# Patient Record
Sex: Female | Born: 1980
Health system: Southern US, Community
[De-identification: ages and names within clinical notes are randomized; demographics above are authoritative.]

## PROBLEM LIST (undated history)

## (undated) DIAGNOSIS — N200 Calculus of kidney: Secondary | ICD-10-CM

## (undated) DIAGNOSIS — J189 Pneumonia, unspecified organism: Secondary | ICD-10-CM

## (undated) DIAGNOSIS — U071 COVID-19: Secondary | ICD-10-CM

## (undated) HISTORY — PX: CHOLECYSTECTOMY: SHX55

## (undated) HISTORY — PX: KNEE ARTHROSCOPY: SUR90

## (undated) HISTORY — PX: TONSILLECTOMY: SUR1361

## (undated) HISTORY — PX: CYSTOSCOPY W/ URETEROSCOPY W/ LITHOTRIPSY: SUR380

---
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
## 2001-06-23 ENCOUNTER — Inpatient Hospital Stay (HOSPITAL_COMMUNITY): Admission: AD | Admit: 2001-06-23 | Discharge: 2001-06-23 | Payer: Self-pay | Admitting: Obstetrics and Gynecology

## 2001-07-02 ENCOUNTER — Inpatient Hospital Stay (HOSPITAL_COMMUNITY): Admission: AD | Admit: 2001-07-02 | Discharge: 2001-07-06 | Payer: Self-pay | Admitting: Obstetrics and Gynecology

## 2001-07-07 ENCOUNTER — Inpatient Hospital Stay (HOSPITAL_COMMUNITY): Admission: AD | Admit: 2001-07-07 | Discharge: 2001-07-10 | Payer: Self-pay | Admitting: Obstetrics and Gynecology

## 2001-07-27 ENCOUNTER — Inpatient Hospital Stay (HOSPITAL_COMMUNITY): Admission: AD | Admit: 2001-07-27 | Discharge: 2001-07-27 | Payer: Self-pay | Admitting: Obstetrics & Gynecology

## 2001-10-27 ENCOUNTER — Emergency Department (HOSPITAL_COMMUNITY): Admission: EM | Admit: 2001-10-27 | Discharge: 2001-10-28 | Payer: Self-pay | Admitting: Emergency Medicine

## 2002-03-24 ENCOUNTER — Observation Stay (HOSPITAL_COMMUNITY): Admission: EM | Admit: 2002-03-24 | Discharge: 2002-03-25 | Payer: Self-pay | Admitting: Emergency Medicine

## 2002-03-24 ENCOUNTER — Encounter: Payer: Self-pay | Admitting: Emergency Medicine

## 2002-03-24 ENCOUNTER — Encounter (INDEPENDENT_AMBULATORY_CARE_PROVIDER_SITE_OTHER): Payer: Self-pay

## 2002-03-26 ENCOUNTER — Inpatient Hospital Stay (HOSPITAL_COMMUNITY): Admission: EM | Admit: 2002-03-26 | Discharge: 2002-03-30 | Payer: Self-pay | Admitting: Emergency Medicine

## 2002-03-26 ENCOUNTER — Encounter: Payer: Self-pay | Admitting: Internal Medicine

## 2002-03-28 ENCOUNTER — Encounter: Payer: Self-pay | Admitting: Urology

## 2004-09-14 ENCOUNTER — Emergency Department (HOSPITAL_COMMUNITY): Admission: EM | Admit: 2004-09-14 | Discharge: 2004-09-14 | Payer: Self-pay | Admitting: Emergency Medicine

## 2004-09-15 ENCOUNTER — Emergency Department (HOSPITAL_COMMUNITY): Admission: EM | Admit: 2004-09-15 | Discharge: 2004-09-16 | Payer: Self-pay | Admitting: Emergency Medicine

## 2005-04-24 ENCOUNTER — Other Ambulatory Visit: Admission: RE | Admit: 2005-04-24 | Discharge: 2005-04-24 | Payer: Self-pay | Admitting: Obstetrics and Gynecology

## 2007-11-04 ENCOUNTER — Ambulatory Visit: Payer: Self-pay | Admitting: Occupational Medicine

## 2008-02-16 ENCOUNTER — Telehealth (INDEPENDENT_AMBULATORY_CARE_PROVIDER_SITE_OTHER): Payer: Self-pay | Admitting: Occupational Medicine

## 2008-02-16 ENCOUNTER — Encounter (INDEPENDENT_AMBULATORY_CARE_PROVIDER_SITE_OTHER): Payer: Self-pay | Admitting: Occupational Medicine

## 2008-02-16 ENCOUNTER — Ambulatory Visit: Payer: Self-pay | Admitting: Family Medicine

## 2008-02-16 DIAGNOSIS — H60509 Unspecified acute noninfective otitis externa, unspecified ear: Secondary | ICD-10-CM | POA: Insufficient documentation

## 2008-04-27 ENCOUNTER — Ambulatory Visit: Payer: Self-pay | Admitting: Occupational Medicine

## 2008-04-27 LAB — CONVERTED CEMR LAB
Bilirubin Urine: NEGATIVE
Ketones, urine, test strip: NEGATIVE
Nitrite: POSITIVE
pH: 6

## 2008-05-05 ENCOUNTER — Emergency Department (HOSPITAL_BASED_OUTPATIENT_CLINIC_OR_DEPARTMENT_OTHER): Admission: EM | Admit: 2008-05-05 | Discharge: 2008-05-05 | Payer: Self-pay | Admitting: Emergency Medicine

## 2010-05-29 DIAGNOSIS — R2230 Localized swelling, mass and lump, unspecified upper limb: Secondary | ICD-10-CM | POA: Insufficient documentation

## 2010-06-30 NOTE — Discharge Summary (Signed)
[facility]  Patient:    Sara Henderson, Sara Henderson Visit Number: [id number] MRN: [id number]          Service Type: MED Location: [facility] Attending Physician:  Lars Pinks Dictated by:   Devoria Albe Edward Jolly, M.D. Admit Date:  07/27/2001 Discharge Date: 07/27/2001                             Discharge Summary  ADMISSION DIAGNOSES:          1. Status post primary low segment transverse                                  cesarean section on Jul 03, 2001.                               2. Endomyometritis.                               3. Betadine burn of the vulva.  DISCHARGE DIAGNOSES:          1. Status post primary low segment transverse                                  cesarean section on Jul 03, 2001.                               2. Endomyometritis, improved.                               3. Betadine burn of the vulva.  ADMISSION HISTORY AND PERTINENT PHYSICAL EXAM:      The patient was a 30 year old, Gravida 1, Para 1-0-0-1, Caucasian female, status post primary low segment transverse cesarean section on Jul 03, 2001 for a nonreassuring fetal assessment with arrest of cervical dilatation, who presented to the hospital on Jul 07, 2001, one day after her discharge complaining of increased abdominal pain, nausea, and a fever to 101 degrees Fahrenheit.  The patients postoperative hospital course had been significant for an irritation of the coccyx and vulvar dermatitis which was thought to be secondary to Betadine use at the time of the patients surgery.  The patients past medical history was remarkable for nephrolithiasis.  The patients physical exam on admission was significant for a temperature of 99.2 degrees Fahrenheit, blood pressure 132/97 and a pulse of 120. Respiratory rate was 24.  The lungs were clear to auscultation bilaterally. The heart demonstrated S1 and S2 with tachycardia.  No evidence of murmur. The breasts were engorged without  evidence of any erythema or streaking.  The back demonstrated no CVA tenderness.  The epidural site demonstrated no ecchymosis or erythema.  The abdomen demonstrated the uterus with the fundus tender to touch.  The incision of the lower abdomen was intact and without erythema.  Pelvic examination demonstrated a deep erythematous rash of the vulva with desquamation of the skin.  Cervical motion tenderness was present and the uterus was tender on bimanual examination.  There was evidence of bilateral adnexal tenderness without masses.  LABORATORY DATA:  WBC 10.5, hematocrit 26.6%.  A catheter specimen of urine demonstrated no WBCs and no RBCs with a trace of leukocyte esterase and 15 mg per dl of ketones.  HOSPITAL COURSE:              The patient was diagnosed with endomyometritis and a Betadine burn of the vulva and was admitted for triple antibiotic therapy with Ampicillin, Gentamicin and Clindamycin, pain control with Percocet and Motrin, and Silvadene cream to the vulva in addition to oral iron therapy for the anemia.  The patients hospital course was unremarkable.  The patient remained afebrile for greater than 48 hours, at which time a decision was made to discharge the patient to home.  She was started on Augmentin 875 mg p.o. b.i.d. the day prior to her discharge.  She was found to be in recovery status and ready for discharge on Jul 10, 2001.  DISCHARGE INSTRUCTIONS:       Augmentin 875 mg p.o. b.i.d. x1 week.  The patient will have a regular diet.  She will continue on decreased activity. She will follow up in the office in one week for a recheck and she will call if she experiences increased fever, increased pain, incision drainage, or any other concern. Dictated by:   Devoria Albe Edward Jolly, M.D. Attending Physician:  Lars Pinks DD:  08/18/01 TD:  08/20/01 Job: [id number]

## 2010-06-30 NOTE — Discharge Summary (Signed)
[facility]  Patient:    Sara Henderson, Sara Henderson Visit Number: [id number] MRN: [id number]          Service Type: MED Location: [facility] Attending Physician:  Lars Pinks Dictated by:   Leilani Able, P.A. Admit Date:  07/27/2001 Discharge Date: 07/27/2001                             Discharge Summary  FINAL DIAGNOSES:              Intrauterine pregnancy at term, active labor, nonreassuring fetal heart tracing, shoulder/body cord.  PROCEDURE:                    Primary low transverse cesarean section.  SURGEON:                      Gerrit Friends. Aldona Bar, M.D.  COMPLICATIONS:                None.  HISTORY:                      This 30 year old G1, P0 presented the evening of May 21 in early labor.  On evaluation at 7:30 a.m. on the 22nd patients cervix had been unchanged and she was having very few contractions.  The patient was offered induction of labor with ruptured membranes versus going home and she chose the former.  At the time of examination patient was about 1 cm dilated, 60% effaced, and -2 station.  Amniotomy was carried and IUPCs were placed and Pitocin was begun.  The patient progressed to about 3-4 cm of dilation, had an epidural, and approximately an hour or so later began having variable decelerations with late recovery.  This was not responsive to position change, oxygen, turning off her Pitocin.  The patients cervix was still unchanged at the time and it was Dr. Willia Craze feeling that this was probably a cord problem.  At this point decision was made to proceed with a cesarean section.  The patient was taken to the operating room by Dr. Annamaria Helling where primary low transverse cesarean section was performed with the delivery of a 5 pound 15 ounce female infant with Apgars of 8 and 9.  The baby was noted to have a shoulder/body cord.  The patients postoperative course was benign without significant fevers.  She did have postoperative  anemia. Was started anemia and was started on iron in hospital.  Her little boy was circumcised before discharge.  She was felt ready for discharge on postoperative day #3.  The patient was sent home on a regular diet.  Told to decrease activities.  Told to continue prenatal vitamins and FeSo4.  Was given a prescription for Percocet one to two q.4h. p.r.n. for pain and was told to follow up in the office in four weeks.  The patient was also to call with any increased pain or bleeding. Dictated by:   Leilani Able, P.A. Attending Physician:  Lars Pinks DD:  07/28/01 TD:  07/29/01 Job: [id number]

## 2010-06-30 NOTE — H&P (Signed)
NAME:  Sara Henderson, Sara Henderson                          ACCOUNT NO.:  [id number]   MEDICAL RECORD NO.:  [id number]                   PATIENT TYPE:   LOCATION:                                       FACILITY:   PHYSICIAN:  Lina Sar, M.D. [facility]               DATE OF BIRTH:  October 06, 1980   DATE OF ADMISSION:  DATE OF DISCHARGE:                                HISTORY & PHYSICAL   PROCEDURE:  ERCP with stone extraction.   INDICATIONS FOR PROCEDURE:  This 30 year old white female was admitted with  persistent epigastric and upper abdominal pain 48 hours following  laparoscopic cholecystectomy for cholelithiasis. Her pre procedure liver  function studies were normal but bilirubin last night was 4. Her white cell  count was elevated to 16,000 initially but was 10,000 yesterday. She is  undergoing ERCP to evaluate her for retained common bile duct stone and  possible bile duct leak.   ENDOSCOPIC ASSESSMENT:  Olympus duodenal scope.   SEDATION:  1. Versed 10 mg IV.  2. Demerol 100 mg IV.  3. Glucagon 1 mg IV.   FINDINGS:  The Olympus duodenal scope passed blindly through the esophagus  into the stomach. Pyloric outlet and gastric antrum was unremarkable. The  papilla was located in the descending duodenum and was identified easily.  The papilla was rather enlarged and edematous with dark bowel exuding  through the papilla. Initially, the main pancreatic duct was cannulated and  showed normal appearance. It was difficult to cannulate the common bile duct  because of stone impaction in the distal common bile duct. About a 5-6 mm  stone was lodged firmly in the distal common bile duct and guidewire  initially could not be passed beyond the common bile duct beyond the stone.  But after repeated attempts to dislodge this stone, we were able to  cannulate the bile duct, place the guidewire and proceed with endoscopic  sphincterotomy. A generous sphincterotomy was carried out and a 12 mm Wilson-  Cook  balloon swept through the bile duct 5-6 times. Only on the third and  fourth occasion, the fragments and most of the stone passed. Biliary  photographs of the stone were taken. Post conclusion of the cholangiogram  showed completely normal common bile duct with mildly dilated intrahepatic  radicles and cystic duct, which had a low take-off from the common bile and  surgical clips. No other stones were seen. The patient tolerated the  procedure well.   IMPRESSION:  1. Choledocholithiasis with stone impaction in the distal common bile duct.  2. Status post recent cholecystectomy.  3. Normal pancreatic duct.  4. Status post sphincterotomy and balloon sweep over the common bile duct     with recovery of the retained stone.    PLAN:  Routine orders have been written for repeated blood work in the  morning, continuation of Unasyn, and her diet.  Lina Sar, M.D. [facility]    DB/MEDQ  D:  03/26/2002  T:  03/26/2002  Job:  [id number]   cc:   Vikki Ports, M.D.  [address]  [place]  [place] [address]  Fax: [phone]

## 2010-06-30 NOTE — H&P (Signed)
   NAME:  [name], [name]                          ACCOUNT NO.:  [id number]   MEDICAL RECORD NO.:  [id number]                   PATIENT TYPE:  INP   LOCATION:  0101                                 FACILITY:  [facility]   PHYSICIAN:  Anselm Pancoast. Zachery Dakins, M.D.          DATE OF BIRTH:  May 14, 1980   DATE OF ADMISSION:  03/25/2002  DATE OF DISCHARGE:                                HISTORY & PHYSICAL   HISTORY:  Sara Henderson is a 30 year old Caucasian female who underwent  emergency laparoscopic cholecystectomy and a cholangiogram by Dr. Danna Hefty on 03/24/02, and was released on the afternoon of 03/25/02.  The  patient states that she was still having pain, was given IV pain medication  this morning, and then when she went home she is continuing to have pain and  called me about 11 p.m. and said she had vomited four times, and she was  having the same pain as she was having preoperatively.  I asked her to come  to the emergency room, and on physical exam she now appears jaundice.  Her  abdomen is not acutely tender.  She has got the recent laparoscopic  cholecystectomy scars, and she is not febrile.  Her liver function studies  now show a bilirubin of 4.1.  SGOT, SGPT, and alkaline phosphatase were  elevated, and her preoperative CMET, which was done on 03/24/02, showed only  a slightly elevated SGOT and SGPT.  There was no cholangiogram done at the  time of surgery, and most likely she has got a retained common duct stone.  She was started on intravenous fluids, pain medication, and IV Unasyn.  She  has no allergies.   I did talk with Dr. Russella Dar, who is on call for the [facility], and he  will have Dr. Juanda Chance see her first thing in the morning and plan on doing an  ERCP.                                               Anselm Pancoast. Zachery Dakins, M.D.    WJW/MEDQ  D:  03/26/2002  T:  03/26/2002  Job:  [id number]

## 2010-06-30 NOTE — Discharge Summary (Signed)
NAME:  Derego, Rosell L                          ACCOUNT NO.:  [id number]   MEDICAL RECORD NO.:  [id number]                   PATIENT TYPE:  INP   LOCATION:  0370                                 FACILITY:  [facility]   PHYSICIAN:  Vikki Ports, M.D.         DATE OF BIRTH:  Dec 12, 1980   DATE OF ADMISSION:  03/26/2002  DATE OF DISCHARGE:  03/30/2002                                 DISCHARGE SUMMARY   ADMISSION DIAGNOSES:  Choledocholithiasis status post laparoscopic  cholecystectomy.   DISCHARGE DIAGNOSES:  Choledocholithiasis status post laparoscopic  cholecystectomy.   PROCEDURE:  ERCP with stone extraction.   CONDITION ON DISCHARGE:  Good and improved.   ADMITTING PHYSICIAN:  Vikki Ports, M.D.   CONSULTING PHYSICIAN:  Lina Sar, M.D. [facility].   HISTORY OF PRESENT ILLNESS:  The patient is a 30 year old white female who  is 36 hours status post a laparoscopic cholecystectomy for acute  cholecystitis.  The patient presented with recurrent right upper quadrant  abdominal pain, nausea, and vomiting.  Liver function tests were elevated at  the time of her representation to the emergency room.  Bilirubin was up to  4.1.  The patient was admitted, started on IV antibiotics, and scheduled for  gastroenterology consultation.   HOSPITAL COURSE:  She was admitted, started on IV antibiotics.  Was seen by  gastroenterology who concurred with the probability of a common bile duct  stone.  She underwent sphincterotomy and extraction of the common bile duct  stone.  Postoperatively she did well, although she continued to complain of  incisional pain as well as right upper quadrant pain and nausea.  She also  began complaining of gross hematuria.  Multiple UAs showed no evidence of  blood and only bilirubin was seen on her urinalysis.  Her liver function  tests continued to improve over the following 48 hours; however, patient  continued to have very high pain requirements.  The  patient also required  extensive nursing counseling and was quite aggressive and belligerent to the  nursing staff.  The patient had remained afebrile.  Had documented three UAs  which were all normal showing no evidence of blood.  Also was tolerating a  regular diet after her very mild self limiting episode of post ERCP  pancreatitis resolved 36 hours post ERCP.  GI signed off on hospital day  number three after advancing her to a regular diet which she tolerated and  again after multiple confrontations with the charge nurses patient finally  was willing to leave the hospital on the 16th.  At that time her vital signs  were normal.  Her laboratory values were normal.  She was eating and having  bowel movements.  She was discharged to home.  Medications include Vicodin  for pain.  Follow-up was with me on a p.r.n. basis.  Vikki Ports, M.D.    KRH/MEDQ  D:  04/15/2002  T:  04/15/2002  Job:  [id number]

## 2010-06-30 NOTE — Op Note (Signed)
   NAME:  Sara Henderson, Sara Henderson                          ACCOUNT NO.:  [id number]   MEDICAL RECORD NO.:  [id number]                   PATIENT TYPE:  INP   LOCATION:  0102                                 FACILITY:  [facility]   PHYSICIAN:  Vikki Ports, M.D.         DATE OF BIRTH:  25-Feb-1980   DATE OF PROCEDURE:  03/24/2002  DATE OF DISCHARGE:                                 OPERATIVE REPORT   PREOPERATIVE DIAGNOSES:  Acute cholecystitis.   POSTOPERATIVE DIAGNOSES:  Acute cholecystitis.   PROCEDURE:  Laparoscopic cholecystectomy.   SURGEON:  Vikki Ports, M.D.   ASSISTANT:  Abigail Miyamoto, M.D.   ANESTHESIA:  General.   DESCRIPTION OF PROCEDURE:  The patient was taken to the operating room,  placed in supine position and after adequate general anesthesia was induced,  the abdomen was prepped and draped in the normal sterile fashion. A  transverse infraumbilical incision was made, dissected down to the fascia.  Fascia was opened vertically. An #0 Vicryl pursestring suture was placed  around the fascial defect. The Hasson trocar was placed in the abdomen and  the abdomen was insufflated with carbon dioxide. Under direct vision, a 10  mm port was placed in the subxiphoid region and two 5 mm ports were placed  in the right abdomen. The gallbladder had a significant number of adhesions  of omentum and obvious signs of chronic inflammation. All of these adhesions  were taken down using Bovie electrocautery. The gallbladder was then  retracted superiorly and laterally. The cystic duct and its junction with  the gallbladder was identified, good dissection was completed posterior to  it until the duct could be completely visualized. The cystic duct was then  triple clipped and divided. The cystic artery also was dissected. The  surrounding structure triply clipped and divided. The gallbladder was then  taken off the gallbladder bed using Bovie electrocautery and placed in an  EndoCatch bag. This was removed through the umbilical port. Adequate  hemostasis was ensured, the right upper quadrant was copiously irrigated.  The infraumbilical fascial defect was closed with #0 Vicryl pursestring  sutures, the incisions were closed with subcuticular 4-0 Monocryl, Steri-  Strips and sterile dressings were applied. The patient tolerated the  procedure well and went to PACU in good condition.                                               Vikki Ports, M.D.    KRH/MEDQ  D:  03/24/2002  T:  03/24/2002  Job:  [id number]

## 2010-06-30 NOTE — Op Note (Signed)
[facility]  Patient:    Sara Henderson, Sara Henderson Visit Number: [id number] MRN: [id number]          Service Type: GYN Location: 910A [id number] [id number] Attending Physician:  Melony Overly Dictated by:   Gerrit Friends. Aldona Bar, M.D. Proc. Date: 07/03/01 Admit Date:  07/07/2001                             Operative Report  AGE:                          30  PREOPERATIVE DIAGNOSES:       1. Term pregnancy.                               2. Active labor.                               3. Nonreassuring fetal heart tracing.  POSTOPERATIVE DIAGNOSES:      1. Term pregnancy.                               2. Active labor.                               3. Nonreassuring fetal heart tracing.                               4. Shoulder/body cord.                               5. Delivery of 5 pound and 16 ounce female with                                  Apgars of 8 and 9.  OPERATION:                    Primary low transverse cesarean section.  SURGEON:                      Gerrit Friends. Aldona Bar, M.D.  ANESTHESIA:                   Epidural.  INDICATIONS:                  This primigravida was admitted late in the evening on Jul 02, 2001, in questionable early labor.  On evaluation at 7:30 a.m. on Jul 03, 2001, her cervix had changed none, and she was having very, very few contractions.  She was offered an induction of labor with ruptured membranes versus going home, and she chose the former, absolutely refusing to go home.  At the time of evaluation at this time, she was 1 cm dilated, 50-60% effaced with the vertex about -2 station.  Amniotomy was carried out with reduction of clear fluid and an IUPC was placed and Pitocin was begun.  The patient began having good contractions, progressed to 3-4 cm dilatation, and received an epidural upon request, and approximately an  hour or so later began having variable decelerations with late recovery, really not responsive to position changes, oxygen,  or turning off the Pitocin.  Her cervix essentially was unchanged and it was my feeling that this was a cord problem and being that she was so remote from delivery the decision was made to advise delivery by cesarean section which was accepted by the patient and her husband.  She is taken to the operating room at this time for a such procedure.  DESCRIPTION OF PROCEDURE:     On the way to the operating room, her epidural was augmented.  A Foley catheter had been inserted prior to arrival in the operating room.  Once in the operating room, she was positioned in the supine position slightly tilted to the left, and prepped and draped in the usual fashion for cesarean section.  At this time, good anesthetic levels were documented.  At this time, a Pfannenstiel incision was made, and dissected down sharply to and through the fascia in a low transverse fashion with hemostasis created at each layer.  The subfascial space was created inferiorly and superiorly, also separated in the midline.  The peritoneum identified and entered appropriately with care taken to avoid the bowel superiorly and the bladder inferiorly.  A bladder blade was placed.  The vesicouterine peritoneum was incised in a low transverse fashion, and pushed off the lower uterine segment with ease.  A sharp incision to the uterus in a low transverse fashion was made with the Metzenbaum scissors and extended with the fingers. Thereafter, delivery of a viable female infant which cried spontaneously at once was carried out with the aid of the vacuum extractor.  There was a body and shoulder cord.  Once the cord had been clamped and the infant passed off to the awaiting team, Apgars were assigned at 8 and 9, and subsequent weight was found to be 5 pounds and 15 ounces upon taking the baby to the nursery.  The baby was taken to the nursery in good condition.  After cord bloods were collected, the placenta was delivered intact.   The uterus at this time was exteriorized, manually inspected, and rendered free of any remaining products of conception.  Good uterine contractility was afforded and was slowly given intravenous Pitocin and manual stimulation.  At this time, closure of the uterus was carried out using a single layer of #1 Vicryl in a running locked fashion which was reinforced with several figure-of-eight #1 Vicryl sutures for hemostasis.  The uterine incision at this time was noted to be dry.  Tubes and ovaries were inspected and noted to be normal.  The abdomen was lavaged of all free blood and clot at this time, and the uterus was replaced into the abdominal cavity.  All counts were noted to be correct at this point and no foreign bodies were noted to be remaining in the abdominal cavity.  Closure of the abdomen was carried out at this time in layers.  The abdominal peritoneum was closed with a 0 Vicryl in a running fashion, and thereafter, muscle was secured with the same.  After good fascial hemostasis, the fascia was then reapproximated with 0 Vicryl from angle to midline bilaterally.  The subcutaneous tissues were then rendered hemostatic and staples were then used to close the skin.  A sterile pressure dressing was applied.  The patient at this time was transported to the recovery room in satisfactory condition having tolerated the procedure well.  Estimated blood loss  500 cc. All counts correct x2.  At the conclusion of the procedure, both mother and baby were doing well in their respective recovery areas.  All counts correct x2.  SUMMARY:  In summary, this patient was induced upon request after having been in labor and delivery for about eight hours with essentially minimal labor and no change to her cervix.  Once she got into good labor and once her epidural was placed, she began progressing.  She started having variable decelerations with late components, and it was felt to be consistent with  a cord, and since she was so remote from delivery, the decision was made to recommend a cesarean  section for delivery.  This was carried out with delivery of a 5 pound and 15 ounce female infant with Apgars of 8 and 9, and there was a shoulder and body cord noted.  Condition on arrival in the recovery room was satisfactory. Dictated by:   Gerrit Friends. Aldona Bar, M.D. Attending Physician:  Melony Overly DD:  07/03/01 TD:  07/07/01 Job: [id number]

## 2010-06-30 NOTE — Op Note (Signed)
[facility]  Patient:    Sara Henderson, Sara Henderson                         MRN: [id number] Proc. Date: 09/05/00 Attending:  Barron Alvine, M.D.                           Operative Report  PREOPERATIVE DIAGNOSIS:  Right distal ureteral calculus.  POSTOPERATIVE DIAGNOSIS:  Right distal ureteral calculus.  PROCEDURE:  Cystoscopy, retrograde pyelography, ureteroscopy, stone basketing and double J stent placement.  SURGEON:  Barron Alvine, M.D.  ANESTHESIA:  General.  INDICATIONS FOR PROCEDURE:  [name] is a 30 year old female. She has one questionable history of nephrolithiasis in the past. She presented to the [facility] Emergency Room last evening with severe flank pain. A CT revealed what was felt to be a 5 mm stone in the distal right ureter. Her pain could not be managed as an outpatient and she was admitted for IV hydration and pain control. We saw her this morning and she was continuing to complain of severe pain. We told her that she had a 50% or better chance of spontaneous passage, but she did not feel like her pain was adequate and she requested intervention. We recommended ureteroscopy which she accepted. Full informed consent was obtained and the patient seemed to understand the risks and benefits of this approach.  TECHNIQUE AND FINDINGS:  The patient was brought to the operating room where she had successful induction of general anesthesia. She was placed in lithotomy position and prepped and draped in the usual manner. Cystoscopy revealed an unremarkable bladder with the exception of some mild right sided trigonal edema and erythema. Retrograde pyelogram confirmed a small filling defect in the distal right ureter. The 6.5 French rigid ureteroscope was then engaged in the ureter after placement of a guidewire into the renal pelvis. The stone was encountered a centimeter above the ureterovesical junction and was basket extracted without difficulty. Because  of considerable edema of the lower ureter, we decided to leave the double J stent indwelling. Over the guidewire, we placed a 6 French 24 double J stent with a dangle string which was attached to the patients inner thigh. She appeared to tolerate the procedure well. DD:  09/05/00 TD:  09/05/00 Job: [id number]

## 2013-10-05 DIAGNOSIS — F429 Obsessive-compulsive disorder, unspecified: Secondary | ICD-10-CM | POA: Insufficient documentation

## 2013-10-30 DIAGNOSIS — F310 Bipolar disorder, current episode hypomanic: Secondary | ICD-10-CM | POA: Insufficient documentation

## 2020-02-23 ENCOUNTER — Emergency Department (INDEPENDENT_AMBULATORY_CARE_PROVIDER_SITE_OTHER)
Admission: RE | Admit: 2020-02-23 | Discharge: 2020-02-23 | Disposition: A | Discharge status: Home / Self Care | Payer: Medicaid Other | Source: Ambulatory Visit

## 2020-02-23 ENCOUNTER — Emergency Department (INDEPENDENT_AMBULATORY_CARE_PROVIDER_SITE_OTHER): Payer: Medicaid Other

## 2020-02-23 ENCOUNTER — Telehealth: Payer: Self-pay | Admitting: Emergency Medicine

## 2020-02-23 ENCOUNTER — Other Ambulatory Visit: Payer: Self-pay

## 2020-02-23 VITALS — BP 118/76 | HR 84 | Temp 98.1°F | Resp 21 | Ht 63.0 in | Wt 211.0 lb

## 2020-02-23 DIAGNOSIS — R0600 Dyspnea, unspecified: Secondary | ICD-10-CM | POA: Diagnosis not present

## 2020-02-23 DIAGNOSIS — U099 Post covid-19 condition, unspecified: Secondary | ICD-10-CM | POA: Diagnosis not present

## 2020-02-23 HISTORY — DX: COVID-19: U07.1

## 2020-02-23 HISTORY — DX: Pneumonia, unspecified organism: J18.9

## 2020-02-23 HISTORY — DX: Calculus of kidney: N20.0

## 2020-02-23 MED ORDER — LEVOCETIRIZINE DIHYDROCHLORIDE 5 MG PO TABS: 5.0000 mg | ORAL_TABLET | Freq: Every evening | ORAL | 0 refills | Status: AC | PRN

## 2020-02-23 MED ORDER — BENZONATATE 100 MG PO CAPS: 100.0000 mg | ORAL_CAPSULE | Freq: Three times a day (TID) | ORAL | 0 refills | Status: AC | PRN

## 2020-02-23 NOTE — Telephone Encounter (Signed)
Left message for Sara Henderson letting her know we can see her today but we are unable to back date a work note to 02/07/20.

## 2020-02-23 NOTE — ED Triage Notes (Signed)
Dx w/ COVID-19 approx 1 month ago per pt  Was seen in ER 1 week ago for pneumonia  Finished Z-pak PCP is working on [employer] leave for patient - unable to get an appointment  Pt continues to have DOE, congestion, & fatigue, nausea Zofran at 1000 today NO COVID or flu vaccine

## 2020-02-23 NOTE — Discharge Instructions (Addendum)
Chest x-ray shows resolved pneumonia. For short of breath, recommend a trial of albuterol inhaler 2 puffs every 4-6 hours as needed for SOB breath.  Follow-up at the post COVID clinic if symptoms persists.

## 2020-02-23 NOTE — ED Provider Notes (Signed)
[facility]    CSN: [id number] Arrival date & time: 02/23/20  1559      History   Chief Complaint Chief Complaint  Patient presents with  . Cough  . Shortness of Breath  . Appointment    HPI Sara Henderson is a 40 y.o. female.   HPI Patient tested positive +COVID 19 01-27-20 and reports ongoing symptoms of cough and SOB, She was seen [facility] 02/08/20 and diagnosed and treated for right lobe pneumonia. She completed Azithromycin and feel that symptoms never completed resolved. She has been unable to return to work due to symptoms. She  Past Medical History:  Diagnosis Date  . COVID   . Kidney stones   . Pneumonia     Patient Active Problem List   Diagnosis Date Noted  . Bipolar I disorder, current or most recent episode hypomanic with mixed features (HCC) 10/30/2013  . Obsessive compulsive disorder 10/05/2013  . Axillary mass 05/29/2010  . OTHER ACUTE OTITIS EXTERNA 02/16/2008    Past Surgical History:  Procedure Laterality Date  . CHOLECYSTECTOMY    . CYSTOSCOPY W/ URETEROSCOPY W/ LITHOTRIPSY    . KNEE ARTHROSCOPY Right    x2  . TONSILLECTOMY      OB History   No obstetric history on file.      Home Medications    Prior to Admission medications   Medication Sig Start Date End Date Taking? Authorizing Provider  citalopram (CELEXA) 40 MG tablet Take 1 tablet by mouth daily. 12/12/16  Yes [provider]  Cholecalciferol 50 MCG (2000 UT) TABS Take by mouth.    [provider]  ondansetron (ZOFRAN-ODT) 4 MG disintegrating tablet Take 4 mg by mouth every 6 (six) hours as needed. 02/08/20   [provider]  SUBOXONE 8-2 MG FILM SMARTSIG:1 Strip(s) Sublingual 3 Times Daily PRN 02/18/20   [provider]    Family History Family History  Problem Relation Age of Onset  . Thyroid disease Mother   . Graves' disease Mother   . Hypertension Mother   . Heart failure Father   . Hypertension Father   . Heart attack  Father     Social History Social History   Tobacco Use  . Smoking status: Former Smoker    Types: Cigarettes, E-cigarettes    Quit date: 07/24/2018    Years since quitting: 1.5  . Smokeless tobacco: Never Used  . Tobacco comment: started vaping in 07/2018  Vaping Use  . Vaping Use: Some days  . Substances: Nicotine, Flavoring  Substance Use Topics  . Alcohol use: Not Currently  . Drug use: Not Currently     Allergies   Bee venom, Morphine and related, Povidone-iodine, and Other   Review of Systems Review of Systems Pertinent negatives listed in HPI  Physical Exam Triage Vital Signs ED Triage Vitals  Enc Vitals Group     BP 02/23/20 1720 118/76     Pulse Rate 02/23/20 1720 84     Resp 02/23/20 1720 (!) 21     Temp 02/23/20 1720 98.1 F (36.7 C)     Temp Source 02/23/20 1718 Oral     SpO2 02/23/20 1720 98 %     Weight 02/23/20 1725 211 lb (95.7 kg)     Height 02/23/20 1725 5\' 3"  (1.6 m)     Head Circumference --      Peak Flow --      Pain Score 02/23/20 1721 4     Pain Loc --  Pain Edu? --      Excl. in GC? --    No data found.  Updated Vital Signs BP 118/76 (BP Location: Left Arm)   Pulse 84   Temp 98.1 F (36.7 C) (Oral)   Resp (!) 21   Ht 5\' 3"  (1.6 m)   Wt 211 lb (95.7 kg)   LMP 02/19/2020 (Approximate)   SpO2 98%   BMI 37.38 kg/m   Visual Acuity Right Eye Distance:   Left Eye Distance:   Bilateral Distance:    Right Eye Near:   Left Eye Near:    Bilateral Near:     Physical Exam General appearance: alert, Ill-appearing, no distress Head: Normocephalic, without obvious abnormality, atraumatic ENT: External ear normal, Congestion,  oropharynx w/o exudate Respiratory: Respirations even , unlabored, coarse lung sound Heart: rate and rhythm normal. No gallop or murmurs noted on exam  Abdomen: BS +, no distention, no rebound tenderness, or no mass Extremities: No gross deformities Skin: Skin color, texture, turgor normal. No rashes  seen  Psych: Appropriate mood and affect. Neurologic:Alert, oriented to person, place, and time, thought content appropriate.   UC Treatments / Results  Labs (all labs ordered are listed, but only abnormal results are displayed) Labs Reviewed - No data to display  EKG   Radiology DG Chest 2 View  Result Date: 02/23/2020 CLINICAL DATA:  History of COVID 19 pneumonia 1 month ago with continued dyspnea on exertion/shortness of breath EXAM: CHEST - 2 VIEW COMPARISON:  None. FINDINGS: The heart size and mediastinal contours are within normal limits. Both lungs are clear. The visualized skeletal structures are unremarkable. Cholecystectomy clips. IMPRESSION: No acute cardiopulmonary disease. Electronically Signed   By: 04/22/2020 MD   On: 02/23/2020 18:11    Procedures Procedures (including critical care time)  Medications Ordered in UC Medications - No data to display  Initial Impression / Assessment and Plan / UC Course  I have reviewed the triage vital signs and the nursing notes.  Pertinent labs & imaging results that were available during my care of the patient were reviewed by me and considered in my medical decision making (see chart for details).     Chest x-ray is negative. Overall exam is reassuring.  Recommended continued symptoms management. Vital signs within expected range. Work note provided indicating patient cleared to return to work. Recommended contacting the post COVID clinic to schedule a follow-up  Final Clinical Impressions(s) / UC Diagnoses   Final diagnoses:  Post-COVID-19 syndrome     Discharge Instructions     Chest x-ray shows resolved pneumonia. For short of breath, recommend a trial of albuterol inhaler 2 puffs every 4-6 hours as needed for SOB breath.  Follow-up at the post COVID clinic if symptoms persists.    ED Prescriptions    Medication Sig Dispense Auth. Provider   benzonatate (TESSALON) 100 MG capsule Take 1-2 capsules (100-200  mg total) by mouth 3 (three) times daily as needed for cough. 40 capsule 04/22/2020, FNP   levocetirizine (XYZAL) 5 MG tablet Take 1 tablet (5 mg total) by mouth at bedtime as needed for allergies. 30 tablet Bing Neighbors, FNP     PDMP not reviewed this encounter.   Bing Neighbors, FNP 02/26/20 [id number]

## 2021-01-09 ENCOUNTER — Ambulatory Visit: Payer: Medicaid Other

## 2022-10-30 IMAGING — DX DG CHEST 2V
2 series · 2 of 2 positions shown · non-contrast
Comparison: None.

CLINICAL DATA: History of COVID 19 pneumonia 1 month ago with
continued dyspnea on exertion/shortness of breath

EXAM:
CHEST - 2 VIEW

[chest pa]
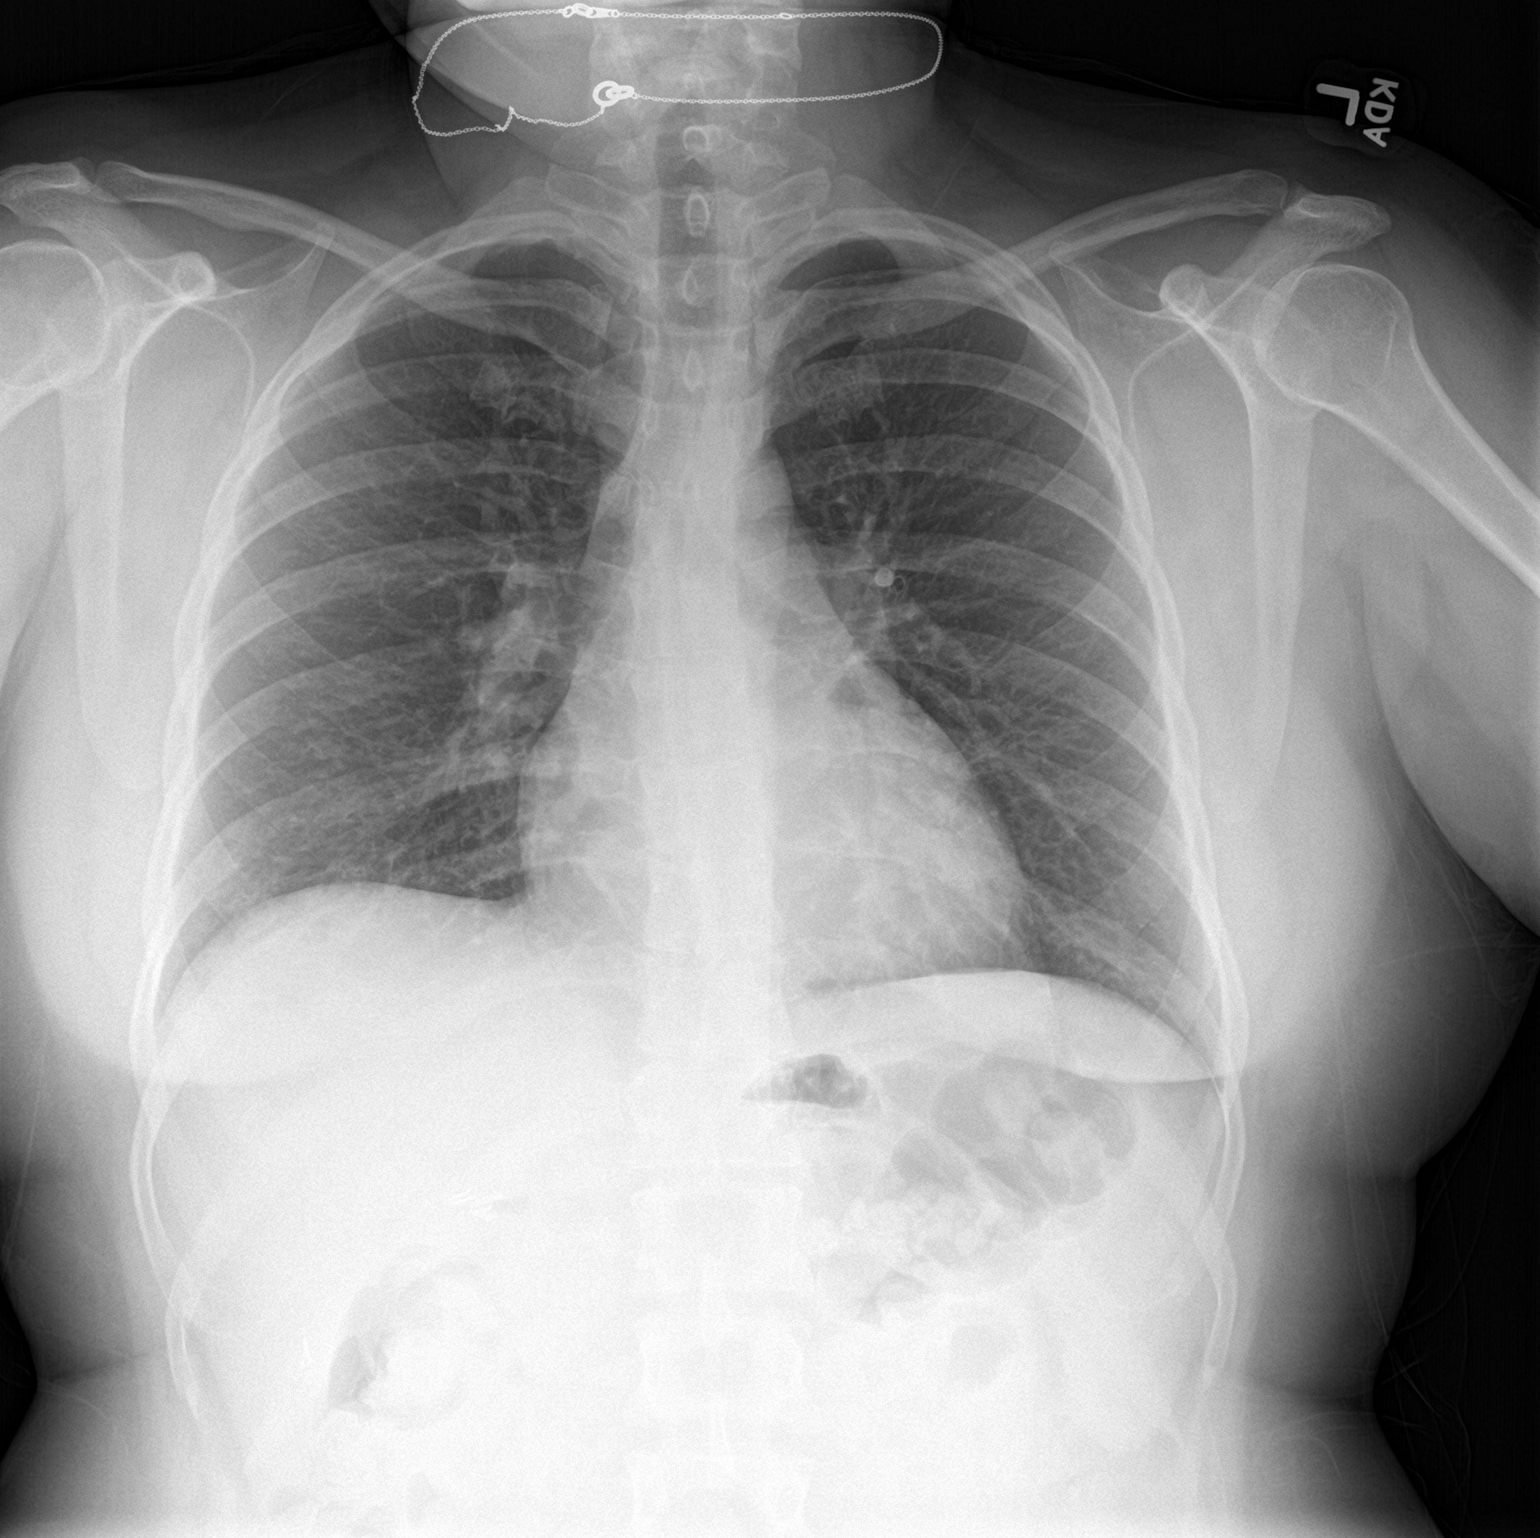

[chest lat]
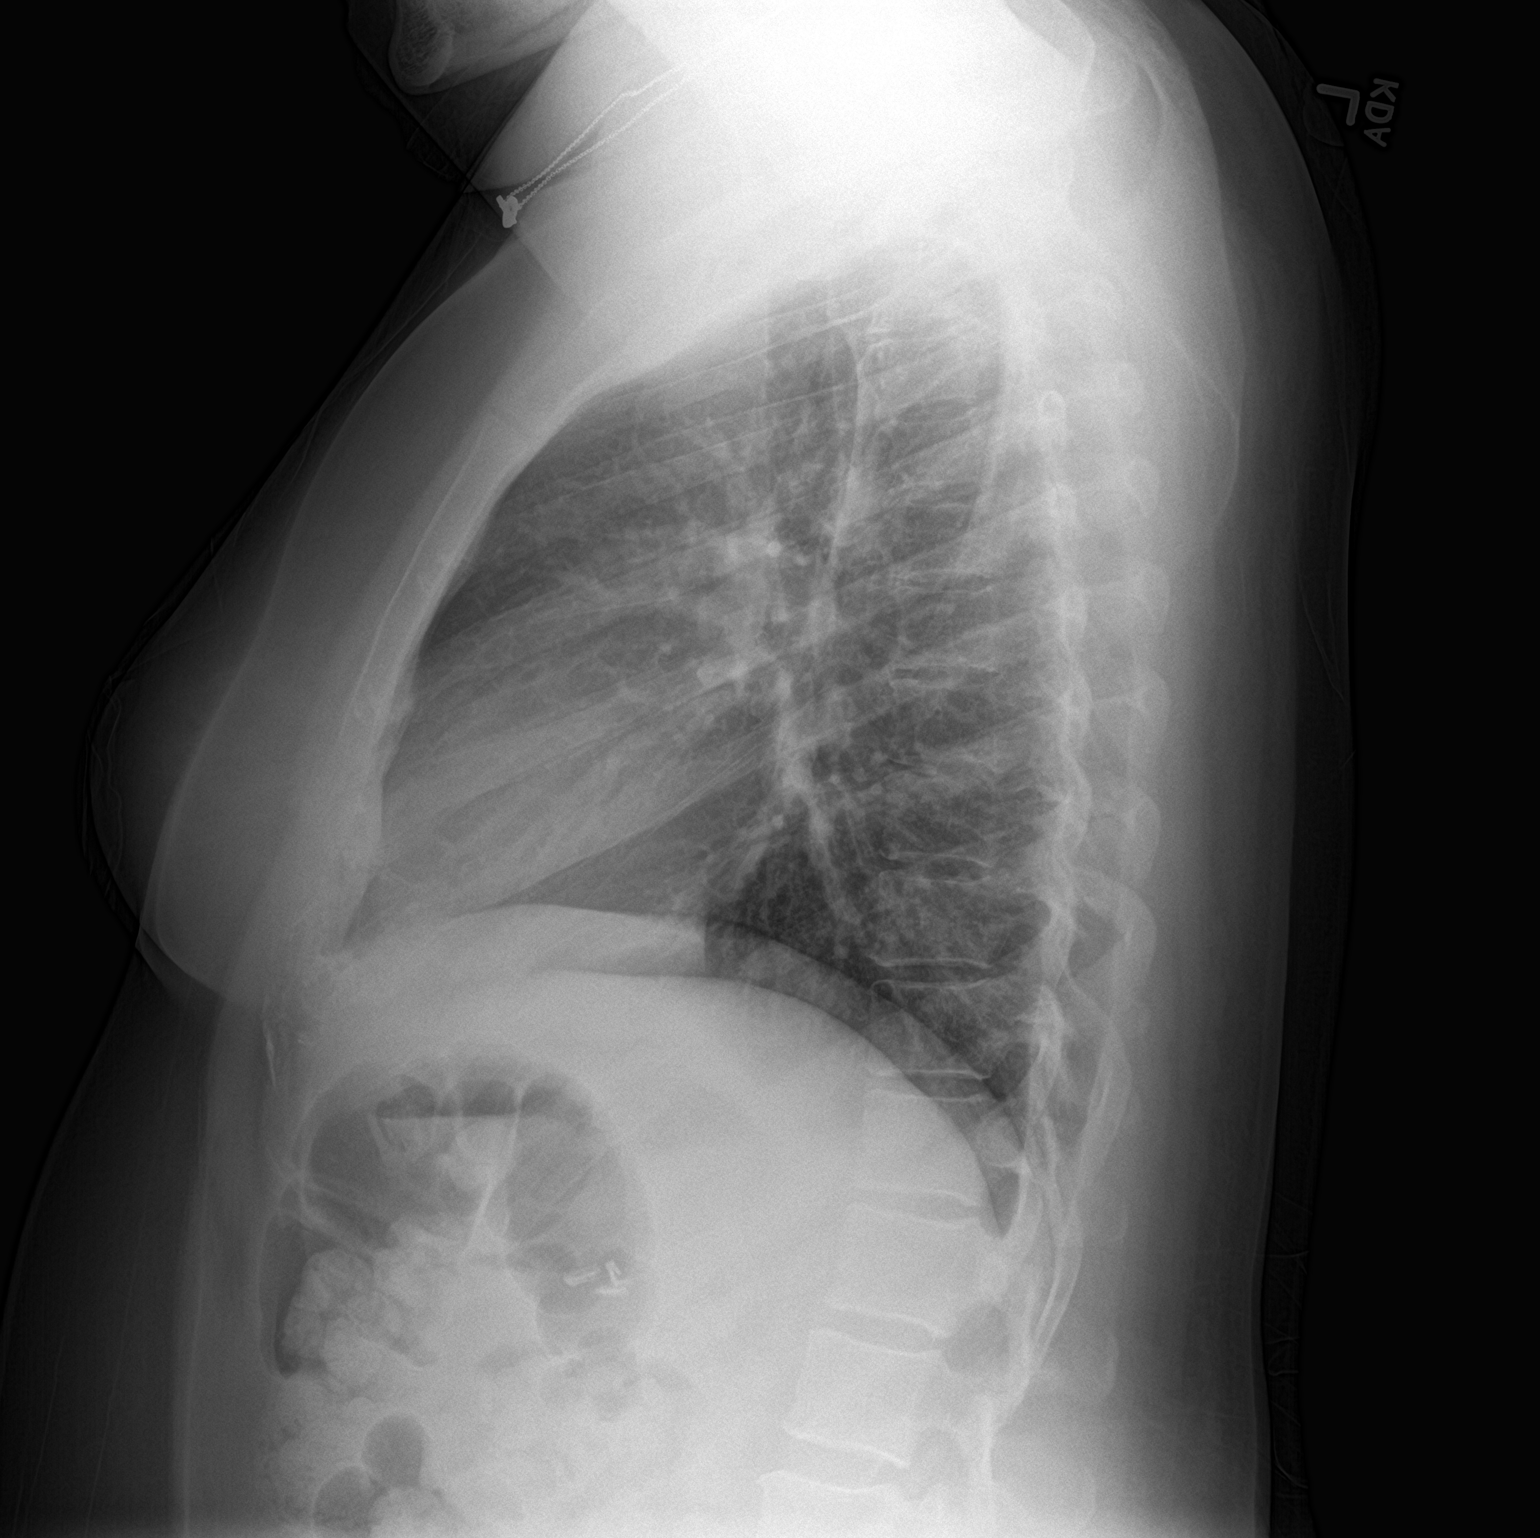

[2 of 2 positions shown; findings below may reference images not displayed]

FINDINGS: The heart size and mediastinal contours are within normal limits.
Both lungs are clear. The visualized skeletal structures are
unremarkable. Cholecystectomy clips.
IMPRESSION: No acute cardiopulmonary disease.
